# Patient Record
Sex: Male | Born: 1990
Health system: Southern US, Community
[De-identification: ages and names within clinical notes are randomized; demographics above are authoritative.]

## PROBLEM LIST (undated history)

## (undated) DIAGNOSIS — F329 Major depressive disorder, single episode, unspecified: Secondary | ICD-10-CM

## (undated) DIAGNOSIS — F32A Depression, unspecified: Secondary | ICD-10-CM

## (undated) DIAGNOSIS — B019 Varicella without complication: Secondary | ICD-10-CM

## (undated) HISTORY — PX: FRACTURE SURGERY: SHX138

## (undated) HISTORY — PX: WISDOM TOOTH EXTRACTION: SHX21

## (undated) HISTORY — DX: Varicella without complication: B01.9

## (undated) HISTORY — DX: Depression, unspecified: F32.A

## (undated) HISTORY — PX: TYMPANOSTOMY: SHX2586

## (undated) HISTORY — DX: Major depressive disorder, single episode, unspecified: F32.9

---
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
## 2015-08-22 ENCOUNTER — Encounter: Payer: Self-pay | Admitting: Family Medicine

## 2015-08-22 ENCOUNTER — Ambulatory Visit (INDEPENDENT_AMBULATORY_CARE_PROVIDER_SITE_OTHER): Payer: 59 | Admitting: Family Medicine

## 2015-08-22 VITALS — BP 110/78 | HR 67 | Temp 98.4°F | Ht 75.25 in | Wt 304.5 lb

## 2015-08-22 DIAGNOSIS — R21 Rash and other nonspecific skin eruption: Secondary | ICD-10-CM | POA: Insufficient documentation

## 2015-08-22 MED ORDER — PREDNISONE 10 MG PO TABS: ORAL_TABLET | ORAL | Status: DC

## 2015-08-22 NOTE — Assessment & Plan Note (Signed)
New problem. Appears contact/allergic in nature. Given location and extensiveness of the rash, I am treating him with a prednisone taper. Follow up if he fails to improve or worsens.

## 2015-08-22 NOTE — Progress Notes (Signed)
Subjective:  Patient ID: Bradley Guadalajararrin Mcnamee, male    DOB: 1991-01-14  Age: 25 y.o. MRN: [id number]  CC: Rash  HPI Bradley Moore is a 25 y.o. male presents to the clinic today as a new patient with complaints of rash.   Rash  Patient reports he's had a rash for the past 2 days.  He reports the rash is located on the shoulders, around the eyes/left face, ears and his right hand.  She reports associated mild itching.  No associated fever or chills.  No mucosal involvement.  No new exposures that he is aware of.  He has travel outside the country recently for vacation.  No interventions tried. No exacerbating or relieving factors.  PMH, Surgical Hx, Family Hx, Social History reviewed and updated as below.  Past Medical History  Diagnosis Date  . Depression    Past Surgical History  Procedure Laterality Date  . Wisdom tooth extraction  2007  . Ankle surgery Right 2006   Family History  Problem Relation Age of Onset  . Hypertension Father   . Breast cancer Maternal Aunt   . Diabetes Paternal Grandfather    Social History  Substance Use Topics  . Smoking status: Never Smoker   . Smokeless tobacco: Never Used  . Alcohol Use: 0.0 oz/week    0 Standard drinks or equivalent per week   Review of Systems  Skin: Positive for rash.  All other systems reviewed and are negative.  Objective:   Today's Vitals: BP 110/78 mmHg  Pulse 67  Temp(Src) 98.4 F (36.9 C) (Oral)  Ht 6' 3.25" (1.911 m)  Wt 304 lb 8 oz (138.12 kg)  BMI 37.82 kg/m2  SpO2 97%  Physical Exam  Constitutional: He is oriented to person, place, and time. He appears well-developed and well-nourished. No distress.  Well developed obese male in NAD.  HENT:  Head: Normocephalic and atraumatic.  Nose: Nose normal.  PERRLA.  Eyes: Conjunctivae are normal. No scleral icterus.  Neck: Normal range of motion.  Cardiovascular: Normal rate and regular rhythm.   No murmur heard. Pulmonary/Chest: Effort normal  and breath sounds normal. He has no wheezes. He has no rales.  Abdominal: Soft. He exhibits no distension. There is no tenderness. There is no rebound and no guarding.  Musculoskeletal: Normal range of motion. He exhibits no edema.  Neurological: He is alert and oriented to person, place, and time.  Skin:  Face - left side of face particularly periorbital region (laterally) with a papular erythematous rash. No drainage. Similar rash noted around the shoulder/upper torso as well as the right hand.   Psychiatric: He has a normal mood and affect.  Vitals reviewed.  Assessment & Plan:   Problem List Items Addressed This Visit    Rash - Primary    New problem. Appears contact/allergic in nature. Given location and extensiveness of the rash, I am treating him with a prednisone taper. Follow up if he fails to improve or worsens.          Outpatient Encounter Prescriptions as of 08/22/2015  Medication Sig  . predniSONE (DELTASONE) 10 MG tablet 50 mg (5 tablets) daily x 3 days, then 40 mg (4 tablets) daily x 3 days, then 30 mg (3 tablets) daily x 3 days, then 20 mg (2 tablets) daily x 3 days, then 10 mg (1 tablet) daily x 3 days.   No facility-administered encounter medications on file as of 08/22/2015.    Follow-up: No Follow-up on file.  Dorie RankJayce  Kenai Fluegel DO [facility] [place]      

## 2015-08-22 NOTE — Patient Instructions (Signed)
Take the prednisone as prescribed.  Let me know if you fail to improve or worsen.  Follow up annually.  Take care  Dr. Adriana Simasook

## 2015-08-22 NOTE — Progress Notes (Signed)
Pre visit review using our clinic review tool, if applicable. No additional management support is needed unless otherwise documented below in the visit note. 

## 2018-05-29 ENCOUNTER — Encounter: Payer: 59 | Admitting: Family

## 2018-06-05 ENCOUNTER — Encounter: Payer: No Typology Code available for payment source | Admitting: Family Medicine

## 2018-10-17 ENCOUNTER — Ambulatory Visit: Payer: Self-pay

## 2018-10-17 NOTE — Telephone Encounter (Signed)
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 He says he was not in direct contact with the 2 employees, but walked by them on his way out to go home. He says they worked last [employer] and was sent home. He says his boss had direct contact with them and he's had direct contact with his boss, who is being tested now. He says he's not having any respiratory symptoms of a cold, no fever, no cough, no chills. He says he and his wife have diarrhea, but they developed it the same time over the weekend and he says it's related to food. I advised if any symptoms arise to go to call the local health department, care advice given, patient verbalized understanding.  Reason for Disposition . [1] CORONAVIRUS EXPOSURE 15 or more days ago AND [2] NO cough or fever or breathing difficulty  Answer Assessment - Initial Assessment Questions 1. PLACE of CONTACT: "Where were you when you were exposed to coronavirus?" (e.g., city, state, country)     [place], [place], [place] 2. TYPE of CONTACT: "How much contact was there?" (e.g., live in same house, work in same office, same school)     Work in same office with boss who was in close contact with them on Monday-Wednesday of last week 3. DATE of CONTACT: "When did you have contact with a coronavirus patient?" (e.g., days)     Monday-Wednesday of last week 4. SYMPTOMS: "Do you have any symptoms?" (e.g., fever, cough, breathing difficulty)     No 5. PREGNANCY OR POSTPARTUM: "Is there any chance you are pregnant?" "When was your last menstrual period?" "Did you deliver in the last 2 weeks?"     N/A 6. HIGH RISK: "Do you have any heart or lung problems? Do you have a weakened immune system?" (e.g., CHF, COPD, asthma, HIV positive, chemotherapy, renal failure, diabetes mellitus, sickle cell anemia)     No  Protocols used: CORONAVIRUS (2019-NCOV) EXPOSURE-A-AH

## 2018-10-19 ENCOUNTER — Telehealth: Payer: Self-pay

## 2018-10-19 ENCOUNTER — Ambulatory Visit: Payer: Self-pay | Admitting: *Deleted

## 2018-10-19 NOTE — Telephone Encounter (Signed)
FYI Pt has appt New Pt next week  Patient is calling with symptoms of severe diarrhea- patient has not been to the office since 2014- NP appointment made- protocol states patient needs to be seen for symptoms and no acute appointment available advised UC and patient agrees

## 2018-10-19 NOTE — Telephone Encounter (Signed)
Patient is calling with symptoms of severe diarrhea- patient has not been to the office since 2014- NP appointment made- protocol states patient needs to be seen for symptoms and no acute appointment available advised UC and patient agrees.  Reason for Disposition . [1] SEVERE diarrhea (e.g., 7 or more times / day more than normal) AND [2] present > 24 hours (1 day)  Answer Assessment - Initial Assessment Questions 1. DIARRHEA SEVERITY: "How bad is the diarrhea?" "How many extra stools have you had in the past 24 hours than normal?"    - NO DIARRHEA (SCALE 0)   - MILD (SCALE 1-3): Few loose or mushy BMs; increase of 1-3 stools over normal daily number of stools; mild increase in ostomy output.   -  MODERATE (SCALE 4-7): Increase of 4-6 stools daily over normal; moderate increase in ostomy output. * SEVERE (SCALE 8-10; OR 'WORST POSSIBLE'): Increase of 7 or more stools daily over normal; moderate increase in ostomy output; incontinence.     8- severe 2. ONSET: "When did the diarrhea begin?"      Saturday night 3. BM CONSISTENCY: "How loose or watery is the diarrhea?"      watery 4. VOMITING: "Are you also vomiting?" If so, ask: "How many times in the past 24 hours?"      no 5. ABDOMINAL PAIN: "Are you having any abdominal pain?" If yes: "What does it feel like?" (e.g., crampy, dull, intermittent, constant)      crampy- gas pain- constant 6. ABDOMINAL PAIN SEVERITY: If present, ask: "How bad is the pain?"  (e.g., Scale 1-10; mild, moderate, or severe)   - MILD (1-3): doesn't interfere with normal activities, abdomen soft and not tender to touch    - MODERATE (4-7): interferes with normal activities or awakens from sleep, tender to touch    - SEVERE (8-10): excruciating pain, doubled over, unable to do any normal activities       3-4 7. ORAL INTAKE: If vomiting, "Have you been able to drink liquids?" "How much fluids have you had in the past 24 hours?"     n/a 8. HYDRATION: "Any signs of  dehydration?" (e.g., dry mouth [not just dry lips], too weak to stand, dizziness, new weight loss) "When did you last urinate?"     Some dizziness yesterday with physical labor- going small amounts 9. EXPOSURE: "Have you traveled to a foreign country recently?" "Have you been exposed to anyone with diarrhea?" "Could you have eaten any food that was spoiled?"     Not that they are aware of- wife is having same symptoms 10. ANTIBIOTIC USE: "Are you taking antibiotics now or have you taken antibiotics in the past 2 months?"       no 11. OTHER SYMPTOMS: "Do you have any other symptoms?" (e.g., fever, blood in stool)       no 12. PREGNANCY: "Is there any chance you are pregnant?" "When was your last menstrual period?"       n/a  Protocols used: DIARRHEA-A-AH

## 2018-10-19 NOTE — Telephone Encounter (Signed)
Yes he needs to be evaluated for his symptoms

## 2018-10-19 NOTE — Telephone Encounter (Signed)
I spoke with patient after talking with Erskine Squibb, nurse from [facility] & learned [facility] refused to see patient due to him being potentially exposed to corona virus. Patient had NO symptoms of corona, only stomach virus symptoms. Do to his place of his work place being closed do to two positive cases I advised patient to wait to hear from Health Dept. Before going anywhere. However, if he did not that there was also [facility] that was a part of [facility]. I gave him location & hours.

## 2018-10-23 ENCOUNTER — Ambulatory Visit (INDEPENDENT_AMBULATORY_CARE_PROVIDER_SITE_OTHER): Payer: BLUE CROSS/BLUE SHIELD

## 2018-10-23 ENCOUNTER — Other Ambulatory Visit: Payer: Self-pay

## 2018-10-23 ENCOUNTER — Encounter: Payer: Self-pay | Admitting: Family Medicine

## 2018-10-23 ENCOUNTER — Ambulatory Visit (INDEPENDENT_AMBULATORY_CARE_PROVIDER_SITE_OTHER): Payer: BLUE CROSS/BLUE SHIELD | Admitting: Family Medicine

## 2018-10-23 VITALS — BP 118/80 | HR 75 | Temp 98.2°F | Ht 75.5 in | Wt 309.0 lb

## 2018-10-23 DIAGNOSIS — M48061 Spinal stenosis, lumbar region without neurogenic claudication: Secondary | ICD-10-CM | POA: Diagnosis not present

## 2018-10-23 DIAGNOSIS — Z0001 Encounter for general adult medical examination with abnormal findings: Secondary | ICD-10-CM | POA: Diagnosis not present

## 2018-10-23 DIAGNOSIS — H9313 Tinnitus, bilateral: Secondary | ICD-10-CM

## 2018-10-23 DIAGNOSIS — M545 Low back pain, unspecified: Secondary | ICD-10-CM

## 2018-10-23 DIAGNOSIS — Z114 Encounter for screening for human immunodeficiency virus [HIV]: Secondary | ICD-10-CM

## 2018-10-23 DIAGNOSIS — R197 Diarrhea, unspecified: Secondary | ICD-10-CM | POA: Diagnosis not present

## 2018-10-23 DIAGNOSIS — Z0000 Encounter for general adult medical examination without abnormal findings: Secondary | ICD-10-CM

## 2018-10-23 DIAGNOSIS — E559 Vitamin D deficiency, unspecified: Secondary | ICD-10-CM

## 2018-10-23 LAB — COMPREHENSIVE METABOLIC PANEL
ALBUMIN: 4.5 g/dL (ref 3.5–5.2)
ALT: 119 U/L — ABNORMAL HIGH (ref 0–53)
AST: 51 U/L — ABNORMAL HIGH (ref 0–37)
Alkaline Phosphatase: 70 U/L (ref 39–117)
BUN: 13 mg/dL (ref 6–23)
CALCIUM: 9.3 mg/dL (ref 8.4–10.5)
CHLORIDE: 107 meq/L (ref 96–112)
CO2: 26 mEq/L (ref 19–32)
Creatinine, Ser: 0.98 mg/dL (ref 0.40–1.50)
GFR: 89.92 mL/min (ref >60.00)
Glucose, Bld: 99 mg/dL (ref 70–99)
POTASSIUM: 3.8 meq/L (ref 3.5–5.1)
Sodium: 141 mEq/L (ref 135–145)
Total Bilirubin: 1.1 mg/dL (ref 0.2–1.2)
Total Protein: 7.1 g/dL (ref 6.0–8.3)

## 2018-10-23 LAB — CBC
HEMATOCRIT: 40.1 % (ref 39.0–52.0)
HEMOGLOBIN: 13.8 g/dL (ref 13.0–17.0)
MCHC: 34.3 g/dL (ref 30.0–36.0)
MCV: 88 fl (ref 78.0–100.0)
PLATELETS: 296 10*3/uL (ref 150.0–400.0)
RBC: 4.56 Mil/uL (ref 4.22–5.81)
RDW: 12.9 % (ref 11.5–15.5)
WBC: 7.7 10*3/uL (ref 4.0–10.5)

## 2018-10-23 LAB — LIPID PANEL
CHOLESTEROL: 112 mg/dL (ref 0–200)
HDL: 33.4 mg/dL — ABNORMAL LOW (ref >39.00)
LDL CALC: 55 mg/dL (ref 0–99)
NonHDL: 79.09
TRIGLYCERIDES: 120 mg/dL (ref 0.0–149.0)
Total CHOL/HDL Ratio: 3
VLDL: 24 mg/dL (ref 0.0–40.0)

## 2018-10-23 LAB — B12 AND FOLATE PANEL
Folate: 12.7 ng/mL (ref >5.9)
VITAMIN B 12: 248 pg/mL (ref 211–911)

## 2018-10-23 LAB — VITAMIN D 25 HYDROXY (VIT D DEFICIENCY, FRACTURES): VITD: 19.24 ng/mL — AB (ref 30.00–100.00)

## 2018-10-23 NOTE — Patient Instructions (Signed)
Tinnitus  Tinnitus refers to hearing a sound when there is no actual source for that sound. This is often described as ringing in the ears. However, people with this condition may hear a variety of noises, in one ear or in both ears.  The sounds of tinnitus can be soft, loud, or somewhere in between. Tinnitus can last for a few seconds or can be constant for days. It may go away without treatment and come back at various times. When tinnitus is constant or happens often, it can lead to other problems, such as trouble sleeping and trouble concentrating.  Almost everyone experiences tinnitus at some point. Tinnitus that is long-lasting (chronic) or comes back often (recurs) may require medical attention.  What are the causes?  The cause of tinnitus is often not known. In some cases, it can result from other problems or conditions, including:  · Exposure to loud noises from machinery, music, or other sources.  · Hearing loss.  · Ear or sinus infections.  · Earwax buildup.  · An object (foreign body) stuck in the ear.  · Taking certain medicines.  · Drinking alcohol or caffeine.  · High blood pressure.  · Heart diseases.  · Anemia.  · Allergies.  · Meniere's disease.  · Thyroid problems.  · Tumors.  · A weak, bulging blood vessel (aneurysm) near the ear.  · Depression or other mood disorders.  What are the signs or symptoms?  The main symptom of tinnitus is hearing a sound when there is no source for that sound. It may sound like:  · Buzzing.  · Roaring.  · Ringing.  · Blowing air, like the sound heard when you listen to a seashell.  · Hissing.  · Whistling.  · Sizzling.  · Humming.  · Running water.  · A musical note.  · Tapping.  Symptoms may affect only one ear (unilateral) or both ears (bilateral).  How is this diagnosed?  Tinnitus is diagnosed based on your symptoms, your medical history, and a physical exam. Your health care provider may do a thorough hearing test (audiologic exam) if your tinnitus:  · Is  unilateral.  · Causes hearing difficulties.  · Lasts 6 months or longer.  You may work with a health care provider who specializes in hearing disorders (audiologist). You may be asked questions about your symptoms and how they affect your daily life. You may have other tests done, such as:  · CT scan.  · MRI.  · An imaging test of how blood flows through your blood vessels (angiogram).  How is this treated?  Treating an underlying medical condition can sometimes make tinnitus go away. If your tinnitus continues, other treatments may include:  · Medicines, such as antidepressants or sleeping aids.  · Sound generators to mask the tinnitus. These include:  ? Tabletop sound machines that play relaxing sounds to help you fall asleep.  ? Wearable devices that fit in your ear and play sounds or music.  ? Acoustic neural stimulation. This involves using headphones to listen to music that contains an auditory signal. Over time, listening to this signal may change some pathways in your brain and make you less sensitive to tinnitus. This treatment is used for very severe cases when no other treatment is working.  · Therapy and counseling to help you manage the stress of living with tinnitus.  · Using hearing aids or cochlear implants if your tinnitus is related to hearing loss. Hearing aids are worn in   the outer ear. Cochlear implants are surgically placed in the inner ear.  Follow these instructions at home:  Managing symptoms         · When possible, avoid being in loud places and being exposed to loud sounds.  · Wear hearing protection, such as earplugs, when you are exposed to loud noises.  · Use a white noise machine, a humidifier, or other devices to mask the sound of tinnitus.  · Practice techniques for reducing stress, such as meditation, yoga, or deep breathing. Work with your health care provider if you need help with managing stress.  · Sleep with your head slightly raised. This may reduce the impact of  tinnitus.  General instructions  · Do not use stimulants, such as nicotine, alcohol, or caffeine. Talk with your health care provider about other stimulants to avoid. Stimulants are substances that can make you feel alert and attentive by increasing certain activities in the body (such as heart rate and blood pressure). These substances may make tinnitus worse.  · Take over-the-counter and prescription medicines only as told by your health care provider.  · Try to get plenty of sleep each night.  · Keep all follow-up visits as told by your health care provider. This is important.  Contact a health care provider if:  · Your tinnitus continues for 3 weeks or longer without stopping.  · Your symptoms get worse or do not get better with home care.  · You develop tinnitus after a head injury.  · You have tinnitus along with any of the following:  ? Dizziness.  ? Loss of balance.  ? Nausea and vomiting.  Summary  · Tinnitus refers to hearing a sound when there is no actual source for that sound. This is often described as ringing in the ears.  · Symptoms may affect only one ear (unilateral) or both ears (bilateral).  · Use a white noise machine, a humidifier, or other devices to mask the sound of tinnitus.  · Do not use stimulants, such as nicotine, alcohol, or caffeine. Talk with your health care provider about other stimulants to avoid. These substances may make tinnitus worse.  This information is not intended to replace advice given to you by your health care provider. Make sure you discuss any questions you have with your health care provider.  Document Released: 07/26/2005 Document Revised: 05/05/2017 Document Reviewed: 05/05/2017  Elsevier Interactive Patient Education © 2019 [employer]

## 2018-10-23 NOTE — Progress Notes (Signed)
Subjective:    Patient ID: Bradley Moore, male    DOB: 1990/10/30, 28 y.o.   MRN: [id number]  HPI  Patient presents to clinic to establish with PCP.  He wants complete physical exam, but also has complaints.  Patient reports from time to time especially when bending over and standing back up he will have a right sided low back pain that is sharp with some lingering muscle ache.  States pain and aching which usually resolve after a short period of time.  Denies any loss of bowel or bladder control, denies lower extremity numbness or tingling.  Also reports a high-pitched buzzing in his ears that waxes and wanes every day, has been present for many months.  Denies working in any loud places, no history of being in [employer], no known head injury.  Also states last week he his wife and their child had diarrhea.  Lasted for 4 to 5 days, but has resolved on its own.  Patient's appetite is now back to normal.  No fever or chills.  No body aches. No nausea or vomiting.   Patient Active Problem List   Diagnosis Date Noted   Rash 08/22/2015   Social History   Tobacco Use   Smoking status: Never Smoker   Smokeless tobacco: Never Used  Substance Use Topics   Alcohol use: Yes    Alcohol/week: 0.0 standard drinks   Past Medical History:  Diagnosis Date   Chicken pox    Chicken pox    Depression    Family History  Problem Relation Age of Onset   Hypertension Father    Breast cancer Maternal Aunt    Diabetes Paternal Grandfather    Past Surgical History:  Procedure Laterality Date   ANKLE SURGERY Right 2006   FRACTURE SURGERY     Right foot    TYMPANOSTOMY     WISDOM TOOTH EXTRACTION  2007   WISDOM TOOTH EXTRACTION      Review of Systems  Constitutional: Negative for chills, fatigue and fever.  HENT: Negative for congestion, ear pain, sinus pain and sore throat. +buzzing in ears Eyes: Negative.   Respiratory: Negative for cough, shortness of breath and  wheezing.   Cardiovascular: Negative for chest pain, palpitations and leg swelling.  Gastrointestinal: Negative for abdominal pain, nausea and vomiting. +diarrhea Genitourinary: Negative for dysuria, frequency and urgency.  Musculoskeletal: +low back pain Skin: Negative for color change, pallor and rash.  Neurological: Negative for syncope, light-headedness and headaches.  Psychiatric/Behavioral: The patient is not nervous/anxious.       Objective:   Physical Exam Vitals signs and nursing note reviewed.  Constitutional:      General: He is not in acute distress.    Appearance: He is not toxic-appearing.  HENT:     Head: Normocephalic and atraumatic.     Right Ear: Tympanic membrane, ear canal and external ear normal. There is no impacted cerumen.     Left Ear: Tympanic membrane, ear canal and external ear normal. There is no impacted cerumen.     Nose: Nose normal.     Mouth/Throat:     Mouth: Mucous membranes are moist.  Eyes:     General: No scleral icterus.    Extraocular Movements: Extraocular movements intact.     Pupils: Pupils are equal, round, and reactive to light.  Neck:     Musculoskeletal: Neck supple. No neck rigidity.  Cardiovascular:     Rate and Rhythm: Normal rate and regular rhythm.  Pulses: Normal pulses.     Heart sounds: Normal heart sounds.  Pulmonary:     Effort: Pulmonary effort is normal.     Breath sounds: Normal breath sounds.  Abdominal:     General: Bowel sounds are normal. There is no distension.     Palpations: Abdomen is soft. There is no mass.     Tenderness: There is no abdominal tenderness. There is no guarding or rebound.     Hernia: No hernia is present.  Musculoskeletal: Normal range of motion.        General: No swelling, tenderness, deformity or signs of injury.  Lymphadenopathy:     Cervical: No cervical adenopathy.  Skin:    General: Skin is warm and dry.     Coloration: Skin is not jaundiced or pale.  Neurological:      Mental Status: He is alert and oriented to person, place, and time.  Psychiatric:        Mood and Affect: Mood normal.        Behavior: Behavior normal.        Thought Content: Thought content normal.    Vitals:   10/23/18 0816  BP: 118/80  Pulse: 75  Temp: 98.2 F (36.8 C)  SpO2: 97%      Assessment & Plan:   Well adult exam-fasting lab work collected today in clinic including HIV screening.  Discussed healthy diet regular exercise, recommend a diet high in lean proteins and lots of vegetables, lower carbs and sugars.  Recommended physical activity between 30 minutes to an hour at least 3 to 5 days a week.  Patient does see dentist 2 times per year, wears glasses and sees eye doctor usually once every 1 to 2 years.  Always wears seatbelt when in car.  Also discussed safe sun practices including wearing SPF at least 30 when outdoors for extended period of time, wearing long sleeves and wearing hat.  Right-sided low back pain-suspect pain is musculoskeletal in nature.  We will do low back x-ray to further investigate.  Discussed use of Aleve as needed for pain, as well as topical rub like BenGay or Biofreeze.  Also recommended stretching exercises to do every day.  Buzzing in ear- ear exam unremarkable in clinic.  Buzzing could be related to the abnormalities in electrolytes, vitamins and thyroid so we will rule this out with labs.  Also discussed trial taking of daily antihistamine such as Claritin to see if this reduces symptoms.  If not this helps, we discussed next step would be to get some sort of MRI imaging of head.  Diarrhea-diarrhea resolved on own.  Most likely related to a viral gastroenteritis due to happening to his wife and child as well.  Discussed good handwashing and wiping down surfaces.  We will see what lab results reveal, and determine next step in plan of care based on those.  Patient aware he can return to clinic anytime if issues arise.

## 2018-10-24 MED ORDER — VITAMIN D (ERGOCALCIFEROL) 1.25 MG (50000 UNIT) PO CAPS: 50000.0000 [IU] | ORAL_CAPSULE | ORAL | 0 refills | Status: AC

## 2018-10-24 NOTE — Addendum Note (Signed)
Addended by: Leanora Cover on: 10/24/2018 12:46 PM   Modules accepted: Orders

## 2018-10-25 LAB — THYROID PANEL WITH TSH
Free Thyroxine Index: 2.2 (ref 1.4–3.8)
T3 Uptake: 30 % (ref 22–35)
T4, Total: 7.2 ug/dL (ref 4.9–10.5)
TSH: 1.53 mIU/L (ref 0.40–4.50)

## 2018-10-25 LAB — HIV ANTIBODY (ROUTINE TESTING W REFLEX): HIV: NONREACTIVE

## 2019-01-23 ENCOUNTER — Other Ambulatory Visit: Payer: BLUE CROSS/BLUE SHIELD

## 2019-05-02 DIAGNOSIS — J342 Deviated nasal septum: Secondary | ICD-10-CM | POA: Diagnosis not present

## 2019-05-02 DIAGNOSIS — J3489 Other specified disorders of nose and nasal sinuses: Secondary | ICD-10-CM | POA: Diagnosis not present

## 2019-05-28 DIAGNOSIS — Z23 Encounter for immunization: Secondary | ICD-10-CM | POA: Diagnosis not present

## 2019-05-31 DIAGNOSIS — M25512 Pain in left shoulder: Secondary | ICD-10-CM | POA: Diagnosis not present

## 2019-05-31 DIAGNOSIS — M545 Low back pain: Secondary | ICD-10-CM | POA: Diagnosis not present

## 2019-05-31 DIAGNOSIS — M9903 Segmental and somatic dysfunction of lumbar region: Secondary | ICD-10-CM | POA: Diagnosis not present

## 2019-05-31 DIAGNOSIS — M9907 Segmental and somatic dysfunction of upper extremity: Secondary | ICD-10-CM | POA: Diagnosis not present

## 2019-06-04 DIAGNOSIS — M9907 Segmental and somatic dysfunction of upper extremity: Secondary | ICD-10-CM | POA: Diagnosis not present

## 2019-06-04 DIAGNOSIS — M9903 Segmental and somatic dysfunction of lumbar region: Secondary | ICD-10-CM | POA: Diagnosis not present

## 2019-06-04 DIAGNOSIS — M25512 Pain in left shoulder: Secondary | ICD-10-CM | POA: Diagnosis not present

## 2019-06-04 DIAGNOSIS — M545 Low back pain: Secondary | ICD-10-CM | POA: Diagnosis not present

## 2019-06-12 DIAGNOSIS — M545 Low back pain: Secondary | ICD-10-CM | POA: Diagnosis not present

## 2019-06-12 DIAGNOSIS — M9907 Segmental and somatic dysfunction of upper extremity: Secondary | ICD-10-CM | POA: Diagnosis not present

## 2019-06-12 DIAGNOSIS — M25512 Pain in left shoulder: Secondary | ICD-10-CM | POA: Diagnosis not present

## 2019-06-12 DIAGNOSIS — M9903 Segmental and somatic dysfunction of lumbar region: Secondary | ICD-10-CM | POA: Diagnosis not present

## 2019-06-18 DIAGNOSIS — M9903 Segmental and somatic dysfunction of lumbar region: Secondary | ICD-10-CM | POA: Diagnosis not present

## 2019-06-18 DIAGNOSIS — M25512 Pain in left shoulder: Secondary | ICD-10-CM | POA: Diagnosis not present

## 2019-06-18 DIAGNOSIS — M9907 Segmental and somatic dysfunction of upper extremity: Secondary | ICD-10-CM | POA: Diagnosis not present

## 2019-06-18 DIAGNOSIS — M545 Low back pain: Secondary | ICD-10-CM | POA: Diagnosis not present

## 2019-06-19 DIAGNOSIS — M545 Low back pain: Secondary | ICD-10-CM | POA: Diagnosis not present

## 2019-06-19 DIAGNOSIS — M25512 Pain in left shoulder: Secondary | ICD-10-CM | POA: Diagnosis not present

## 2019-06-19 DIAGNOSIS — M9903 Segmental and somatic dysfunction of lumbar region: Secondary | ICD-10-CM | POA: Diagnosis not present

## 2019-06-19 DIAGNOSIS — M9907 Segmental and somatic dysfunction of upper extremity: Secondary | ICD-10-CM | POA: Diagnosis not present

## 2019-06-22 DIAGNOSIS — M9907 Segmental and somatic dysfunction of upper extremity: Secondary | ICD-10-CM | POA: Diagnosis not present

## 2019-06-22 DIAGNOSIS — M545 Low back pain: Secondary | ICD-10-CM | POA: Diagnosis not present

## 2019-06-22 DIAGNOSIS — M25512 Pain in left shoulder: Secondary | ICD-10-CM | POA: Diagnosis not present

## 2019-06-22 DIAGNOSIS — M9903 Segmental and somatic dysfunction of lumbar region: Secondary | ICD-10-CM | POA: Diagnosis not present

## 2019-06-25 DIAGNOSIS — M9903 Segmental and somatic dysfunction of lumbar region: Secondary | ICD-10-CM | POA: Diagnosis not present

## 2019-06-25 DIAGNOSIS — M9907 Segmental and somatic dysfunction of upper extremity: Secondary | ICD-10-CM | POA: Diagnosis not present

## 2019-06-25 DIAGNOSIS — M545 Low back pain: Secondary | ICD-10-CM | POA: Diagnosis not present

## 2019-06-25 DIAGNOSIS — M25512 Pain in left shoulder: Secondary | ICD-10-CM | POA: Diagnosis not present

## 2019-06-26 DIAGNOSIS — M25512 Pain in left shoulder: Secondary | ICD-10-CM | POA: Diagnosis not present

## 2019-06-26 DIAGNOSIS — M545 Low back pain: Secondary | ICD-10-CM | POA: Diagnosis not present

## 2019-06-26 DIAGNOSIS — M9903 Segmental and somatic dysfunction of lumbar region: Secondary | ICD-10-CM | POA: Diagnosis not present

## 2019-06-26 DIAGNOSIS — M9907 Segmental and somatic dysfunction of upper extremity: Secondary | ICD-10-CM | POA: Diagnosis not present

## 2019-06-29 DIAGNOSIS — M545 Low back pain: Secondary | ICD-10-CM | POA: Diagnosis not present

## 2019-06-29 DIAGNOSIS — M25512 Pain in left shoulder: Secondary | ICD-10-CM | POA: Diagnosis not present

## 2019-06-29 DIAGNOSIS — M9907 Segmental and somatic dysfunction of upper extremity: Secondary | ICD-10-CM | POA: Diagnosis not present

## 2019-06-29 DIAGNOSIS — M9903 Segmental and somatic dysfunction of lumbar region: Secondary | ICD-10-CM | POA: Diagnosis not present

## 2019-07-03 DIAGNOSIS — J3489 Other specified disorders of nose and nasal sinuses: Secondary | ICD-10-CM | POA: Diagnosis not present

## 2019-07-03 DIAGNOSIS — J310 Chronic rhinitis: Secondary | ICD-10-CM | POA: Diagnosis not present

## 2019-07-03 DIAGNOSIS — M9903 Segmental and somatic dysfunction of lumbar region: Secondary | ICD-10-CM | POA: Diagnosis not present

## 2019-07-03 DIAGNOSIS — J342 Deviated nasal septum: Secondary | ICD-10-CM | POA: Diagnosis not present

## 2019-07-03 DIAGNOSIS — J343 Hypertrophy of nasal turbinates: Secondary | ICD-10-CM | POA: Diagnosis not present

## 2019-07-03 DIAGNOSIS — M25512 Pain in left shoulder: Secondary | ICD-10-CM | POA: Diagnosis not present

## 2019-07-03 DIAGNOSIS — M9907 Segmental and somatic dysfunction of upper extremity: Secondary | ICD-10-CM | POA: Diagnosis not present

## 2019-07-03 DIAGNOSIS — M545 Low back pain: Secondary | ICD-10-CM | POA: Diagnosis not present

## 2019-07-04 DIAGNOSIS — M9907 Segmental and somatic dysfunction of upper extremity: Secondary | ICD-10-CM | POA: Diagnosis not present

## 2019-07-04 DIAGNOSIS — M25512 Pain in left shoulder: Secondary | ICD-10-CM | POA: Diagnosis not present

## 2019-07-04 DIAGNOSIS — M545 Low back pain: Secondary | ICD-10-CM | POA: Diagnosis not present

## 2019-07-04 DIAGNOSIS — M9903 Segmental and somatic dysfunction of lumbar region: Secondary | ICD-10-CM | POA: Diagnosis not present

## 2019-07-09 DIAGNOSIS — M545 Low back pain: Secondary | ICD-10-CM | POA: Diagnosis not present

## 2019-07-09 DIAGNOSIS — M25512 Pain in left shoulder: Secondary | ICD-10-CM | POA: Diagnosis not present

## 2019-07-09 DIAGNOSIS — M9907 Segmental and somatic dysfunction of upper extremity: Secondary | ICD-10-CM | POA: Diagnosis not present

## 2019-07-09 DIAGNOSIS — M9903 Segmental and somatic dysfunction of lumbar region: Secondary | ICD-10-CM | POA: Diagnosis not present

## 2019-07-13 DIAGNOSIS — M545 Low back pain: Secondary | ICD-10-CM | POA: Diagnosis not present

## 2019-07-13 DIAGNOSIS — M9907 Segmental and somatic dysfunction of upper extremity: Secondary | ICD-10-CM | POA: Diagnosis not present

## 2019-07-13 DIAGNOSIS — M9903 Segmental and somatic dysfunction of lumbar region: Secondary | ICD-10-CM | POA: Diagnosis not present

## 2019-07-13 DIAGNOSIS — M25512 Pain in left shoulder: Secondary | ICD-10-CM | POA: Diagnosis not present

## 2019-07-16 DIAGNOSIS — M9903 Segmental and somatic dysfunction of lumbar region: Secondary | ICD-10-CM | POA: Diagnosis not present

## 2019-07-16 DIAGNOSIS — M545 Low back pain: Secondary | ICD-10-CM | POA: Diagnosis not present

## 2019-07-16 DIAGNOSIS — M9907 Segmental and somatic dysfunction of upper extremity: Secondary | ICD-10-CM | POA: Diagnosis not present

## 2019-07-16 DIAGNOSIS — M25512 Pain in left shoulder: Secondary | ICD-10-CM | POA: Diagnosis not present

## 2019-07-20 DIAGNOSIS — M9907 Segmental and somatic dysfunction of upper extremity: Secondary | ICD-10-CM | POA: Diagnosis not present

## 2019-07-20 DIAGNOSIS — M9903 Segmental and somatic dysfunction of lumbar region: Secondary | ICD-10-CM | POA: Diagnosis not present

## 2019-07-20 DIAGNOSIS — M25512 Pain in left shoulder: Secondary | ICD-10-CM | POA: Diagnosis not present

## 2019-07-20 DIAGNOSIS — M545 Low back pain: Secondary | ICD-10-CM | POA: Diagnosis not present

## 2019-07-23 DIAGNOSIS — M9907 Segmental and somatic dysfunction of upper extremity: Secondary | ICD-10-CM | POA: Diagnosis not present

## 2019-07-23 DIAGNOSIS — M545 Low back pain: Secondary | ICD-10-CM | POA: Diagnosis not present

## 2019-07-23 DIAGNOSIS — M25512 Pain in left shoulder: Secondary | ICD-10-CM | POA: Diagnosis not present

## 2019-07-23 DIAGNOSIS — M9903 Segmental and somatic dysfunction of lumbar region: Secondary | ICD-10-CM | POA: Diagnosis not present

## 2019-07-27 DIAGNOSIS — M545 Low back pain: Secondary | ICD-10-CM | POA: Diagnosis not present

## 2019-07-27 DIAGNOSIS — M25512 Pain in left shoulder: Secondary | ICD-10-CM | POA: Diagnosis not present

## 2019-07-27 DIAGNOSIS — M9907 Segmental and somatic dysfunction of upper extremity: Secondary | ICD-10-CM | POA: Diagnosis not present

## 2019-07-27 DIAGNOSIS — M9903 Segmental and somatic dysfunction of lumbar region: Secondary | ICD-10-CM | POA: Diagnosis not present

## 2019-07-30 DIAGNOSIS — M25512 Pain in left shoulder: Secondary | ICD-10-CM | POA: Diagnosis not present

## 2019-07-30 DIAGNOSIS — M9903 Segmental and somatic dysfunction of lumbar region: Secondary | ICD-10-CM | POA: Diagnosis not present

## 2019-07-30 DIAGNOSIS — M9907 Segmental and somatic dysfunction of upper extremity: Secondary | ICD-10-CM | POA: Diagnosis not present

## 2019-07-30 DIAGNOSIS — M545 Low back pain: Secondary | ICD-10-CM | POA: Diagnosis not present

## 2019-08-07 DIAGNOSIS — M9907 Segmental and somatic dysfunction of upper extremity: Secondary | ICD-10-CM | POA: Diagnosis not present

## 2019-08-07 DIAGNOSIS — M25512 Pain in left shoulder: Secondary | ICD-10-CM | POA: Diagnosis not present

## 2019-08-07 DIAGNOSIS — M545 Low back pain: Secondary | ICD-10-CM | POA: Diagnosis not present

## 2019-08-07 DIAGNOSIS — M9903 Segmental and somatic dysfunction of lumbar region: Secondary | ICD-10-CM | POA: Diagnosis not present

## 2019-08-29 ENCOUNTER — Ambulatory Visit: Payer: BC Managed Care – PPO | Attending: Internal Medicine

## 2019-08-29 DIAGNOSIS — Z20822 Contact with and (suspected) exposure to covid-19: Secondary | ICD-10-CM | POA: Diagnosis not present

## 2019-08-30 LAB — NOVEL CORONAVIRUS, NAA: SARS-CoV-2, NAA: NOT DETECTED

## 2020-01-17 DIAGNOSIS — F329 Major depressive disorder, single episode, unspecified: Secondary | ICD-10-CM | POA: Diagnosis not present

## 2020-01-17 DIAGNOSIS — Z8659 Personal history of other mental and behavioral disorders: Secondary | ICD-10-CM | POA: Diagnosis not present

## 2020-01-17 DIAGNOSIS — Z79899 Other long term (current) drug therapy: Secondary | ICD-10-CM | POA: Diagnosis not present

## 2020-01-17 DIAGNOSIS — J342 Deviated nasal septum: Secondary | ICD-10-CM | POA: Diagnosis not present

## 2020-01-17 DIAGNOSIS — Z6838 Body mass index (BMI) 38.0-38.9, adult: Secondary | ICD-10-CM | POA: Diagnosis not present

## 2020-01-17 DIAGNOSIS — J3489 Other specified disorders of nose and nasal sinuses: Secondary | ICD-10-CM | POA: Diagnosis not present

## 2020-01-17 DIAGNOSIS — J343 Hypertrophy of nasal turbinates: Secondary | ICD-10-CM | POA: Diagnosis not present

## 2020-01-17 DIAGNOSIS — E669 Obesity, unspecified: Secondary | ICD-10-CM | POA: Diagnosis not present

## 2020-01-21 IMAGING — DX LUMBAR SPINE - COMPLETE 4+ VIEW
5 series · 5 of 5 positions shown · non-contrast
Comparison: None.

CLINICAL DATA: 27-year-old male with lower back pain off and on for
several years. No known injury. Initial encounter.

EXAM:
LUMBAR SPINE - COMPLETE 4+ VIEW

[lumbar spine ap]
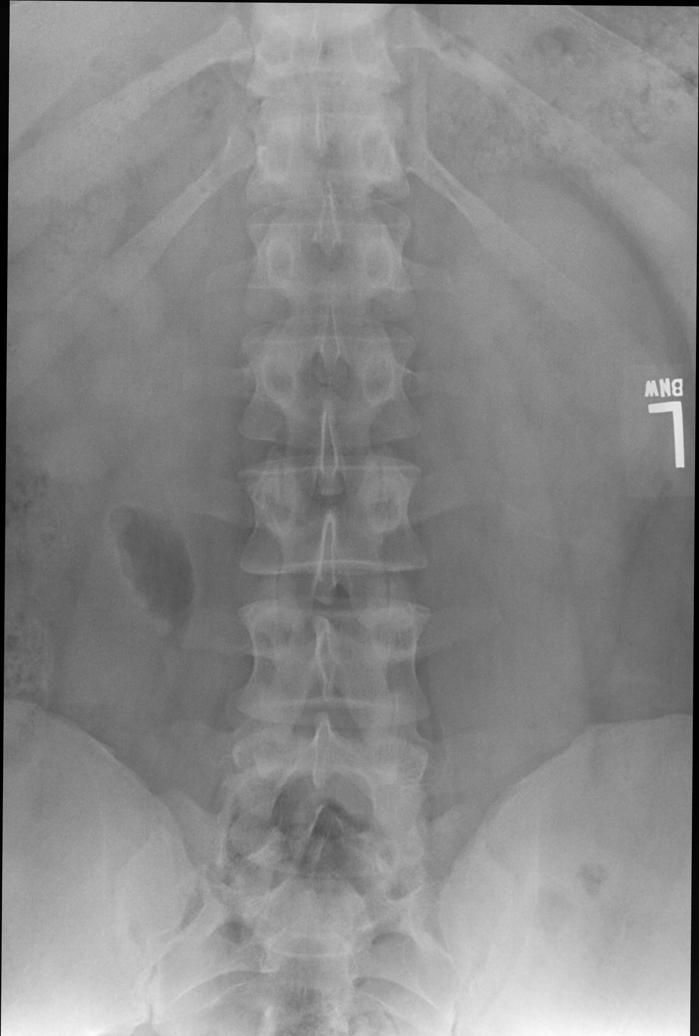

[lumbar spine obl (oblique) (1 of 2)]
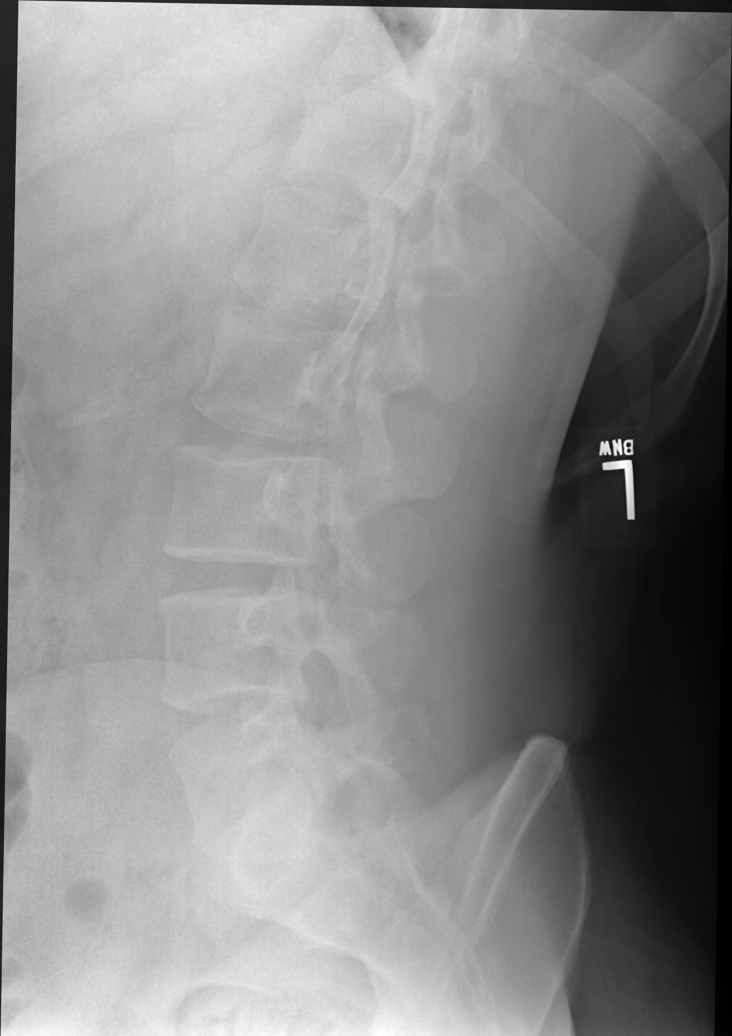

[lumbar spine obl (oblique) (2 of 2)]
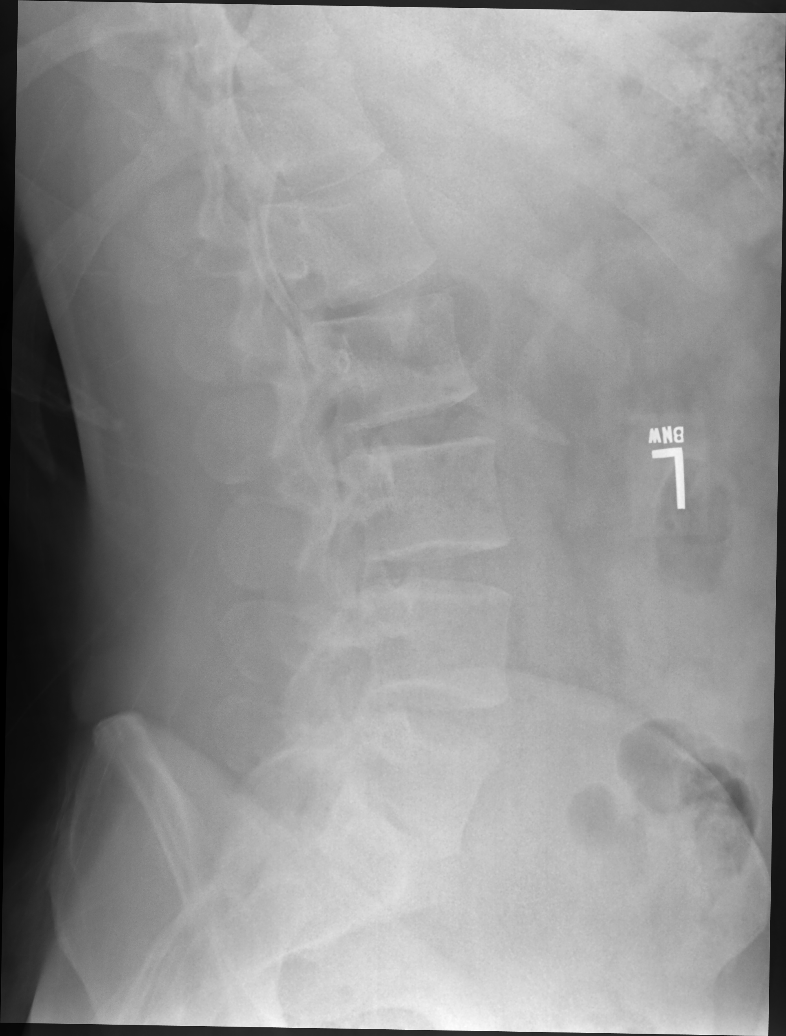

[lumbar spine lat]
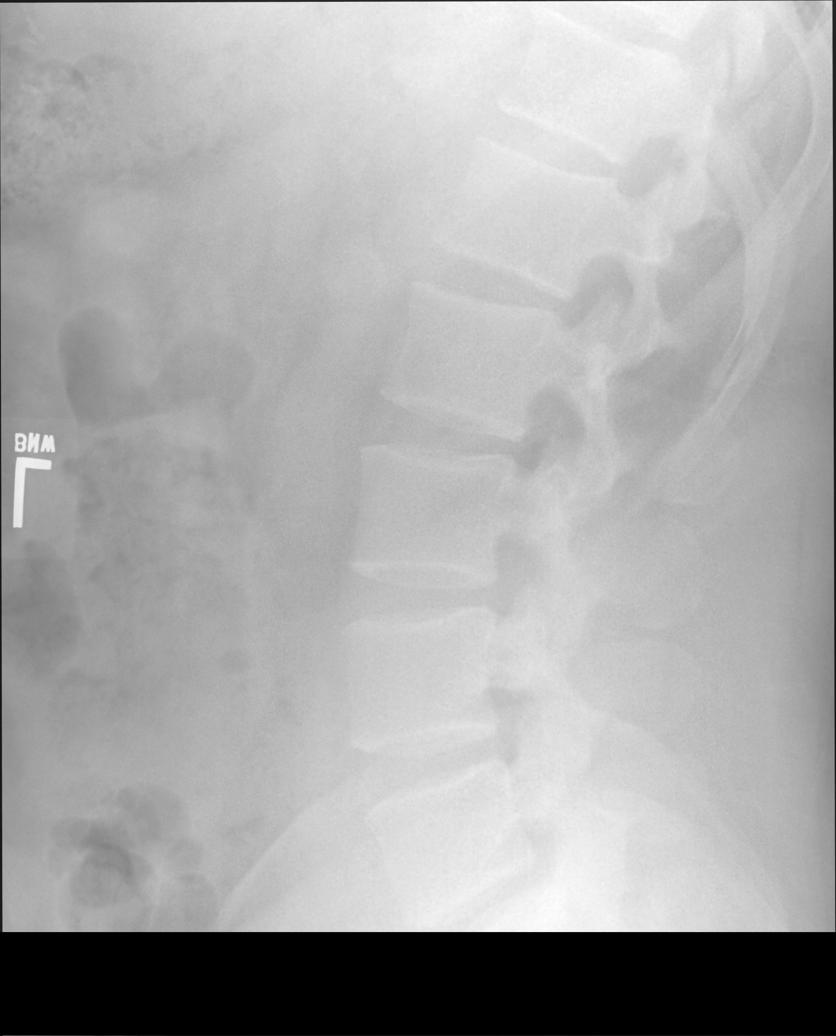

[lumbar spot lat]
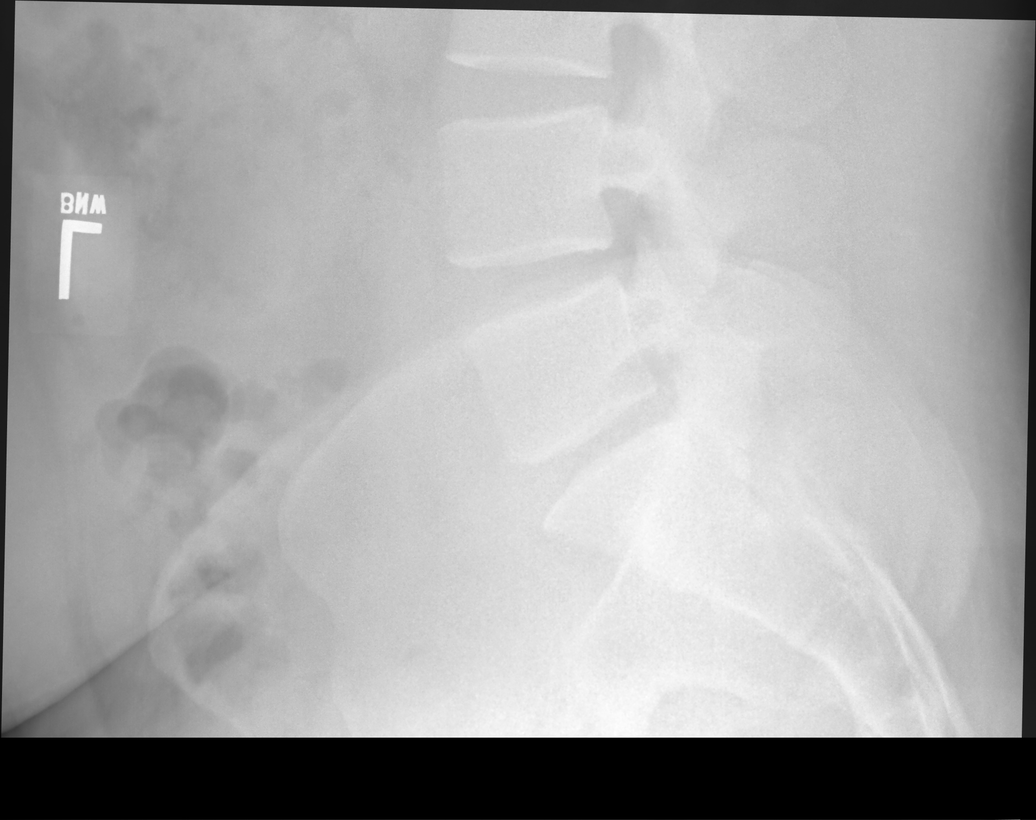

[5 of 5 positions shown; findings below may reference images not displayed]

FINDINGS: No compression fracture or pars defect noted. Relatively normal
alignment. Minimal L5-S1 disc space narrowing. Unremarkable bowel
gas pattern.
IMPRESSION: Minimal L5-S1 disc space narrowing.

## 2020-03-20 DIAGNOSIS — Z0000 Encounter for general adult medical examination without abnormal findings: Secondary | ICD-10-CM | POA: Diagnosis not present

## 2020-03-20 DIAGNOSIS — Z6838 Body mass index (BMI) 38.0-38.9, adult: Secondary | ICD-10-CM | POA: Diagnosis not present

## 2020-03-20 DIAGNOSIS — Z833 Family history of diabetes mellitus: Secondary | ICD-10-CM | POA: Diagnosis not present

## 2020-05-05 DIAGNOSIS — Z09 Encounter for follow-up examination after completed treatment for conditions other than malignant neoplasm: Secondary | ICD-10-CM | POA: Diagnosis not present
